# Patient Record
Sex: Female | Born: 1995 | Race: White | Hispanic: No | Marital: Single | Smoking: Never smoker
Health system: Southern US, Community
[De-identification: ages and names within clinical notes are randomized; demographics above are authoritative.]

## PROBLEM LIST (undated history)

## (undated) DIAGNOSIS — Z789 Other specified health status: Secondary | ICD-10-CM

## (undated) HISTORY — PX: NO PAST SURGERIES: SHX2092

---
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
## 2016-01-06 ENCOUNTER — Observation Stay (HOSPITAL_COMMUNITY)
Admission: EM | Admit: 2016-01-06 | Discharge: 2016-01-07 | Disposition: A | Discharge status: Home / Self Care | Payer: Self-pay | Attending: Internal Medicine | Admitting: Internal Medicine

## 2016-01-06 ENCOUNTER — Encounter (HOSPITAL_COMMUNITY): Payer: Self-pay | Admitting: Emergency Medicine

## 2016-01-06 ENCOUNTER — Emergency Department (HOSPITAL_COMMUNITY): Payer: Self-pay

## 2016-01-06 DIAGNOSIS — R55 Syncope and collapse: Secondary | ICD-10-CM | POA: Insufficient documentation

## 2016-01-06 DIAGNOSIS — S02119A Unspecified fracture of occiput, initial encounter for closed fracture: Principal | ICD-10-CM | POA: Diagnosis present

## 2016-01-06 DIAGNOSIS — W19XXXA Unspecified fall, initial encounter: Secondary | ICD-10-CM | POA: Insufficient documentation

## 2016-01-06 DIAGNOSIS — S069X1A Unspecified intracranial injury with loss of consciousness of 30 minutes or less, initial encounter: Secondary | ICD-10-CM

## 2016-01-06 DIAGNOSIS — S0633AA Contusion and laceration of cerebrum, unspecified, with loss of consciousness status unknown, initial encounter: Secondary | ICD-10-CM | POA: Diagnosis present

## 2016-01-06 DIAGNOSIS — S06339A Contusion and laceration of cerebrum, unspecified, with loss of consciousness of unspecified duration, initial encounter: Secondary | ICD-10-CM

## 2016-01-06 DIAGNOSIS — S06320A Contusion and laceration of left cerebrum without loss of consciousness, initial encounter: Secondary | ICD-10-CM | POA: Insufficient documentation

## 2016-01-06 DIAGNOSIS — S069X9A Unspecified intracranial injury with loss of consciousness of unspecified duration, initial encounter: Secondary | ICD-10-CM | POA: Diagnosis present

## 2016-01-06 DIAGNOSIS — S069XAA Unspecified intracranial injury with loss of consciousness status unknown, initial encounter: Secondary | ICD-10-CM | POA: Diagnosis present

## 2016-01-06 HISTORY — DX: Other specified health status: Z78.9

## 2016-01-06 LAB — CBC WITH DIFFERENTIAL/PLATELET
Basophils Absolute: 0 10*3/uL (ref 0.0–0.1)
Basophils Relative: 0 %
Eosinophils Absolute: 0 10*3/uL (ref 0.0–0.7)
Eosinophils Relative: 0 %
HCT: 44.1 % (ref 36.0–46.0)
Hemoglobin: 14.4 g/dL (ref 12.0–15.0)
Lymphocytes Relative: 12 %
Lymphs Abs: 1.4 10*3/uL (ref 0.7–4.0)
MCH: 29.7 pg (ref 26.0–34.0)
MCHC: 32.7 g/dL (ref 30.0–36.0)
MCV: 90.9 fL (ref 78.0–100.0)
Monocytes Absolute: 0.6 10*3/uL (ref 0.1–1.0)
Monocytes Relative: 5 %
Neutro Abs: 9.8 10*3/uL — ABNORMAL HIGH (ref 1.7–7.7)
Neutrophils Relative %: 83 %
Platelets: 263 10*3/uL (ref 150–400)
RBC: 4.85 MIL/uL (ref 3.87–5.11)
RDW: 14.6 % (ref 11.5–15.5)
WBC: 11.9 10*3/uL — ABNORMAL HIGH (ref 4.0–10.5)

## 2016-01-06 LAB — URINALYSIS, ROUTINE W REFLEX MICROSCOPIC
Bilirubin Urine: NEGATIVE
Glucose, UA: NEGATIVE mg/dL
Hgb urine dipstick: NEGATIVE
Ketones, ur: NEGATIVE mg/dL
Leukocytes, UA: NEGATIVE
Nitrite: NEGATIVE
Protein, ur: NEGATIVE mg/dL
Specific Gravity, Urine: 1.025 (ref 1.005–1.030)
pH: 7 (ref 5.0–8.0)

## 2016-01-06 LAB — BASIC METABOLIC PANEL
Anion gap: 8 (ref 5–15)
BUN: 13 mg/dL (ref 6–20)
CO2: 24 mmol/L (ref 22–32)
Calcium: 9.2 mg/dL (ref 8.9–10.3)
Chloride: 106 mmol/L (ref 101–111)
Creatinine, Ser: 0.65 mg/dL (ref 0.44–1.00)
GFR calc Af Amer: >60 mL/min (ref >60)
GFR calc non Af Amer: >60 mL/min (ref >60)
Glucose, Bld: 85 mg/dL (ref 65–99)
Potassium: 4 mmol/L (ref 3.5–5.1)
Sodium: 138 mmol/L (ref 135–145)

## 2016-01-06 LAB — PROTIME-INR
INR: 1.1
Prothrombin Time: 14.3 seconds (ref 11.4–15.2)

## 2016-01-06 LAB — APTT: aPTT: 29 seconds (ref 24–36)

## 2016-01-06 LAB — PREGNANCY, URINE: Preg Test, Ur: NEGATIVE

## 2016-01-06 MED ORDER — ACETAMINOPHEN 325 MG PO TABS
650.0000 mg | ORAL_TABLET | Freq: Four times a day (QID) | ORAL | Status: DC | PRN
  Administered 2016-01-07 (×2): 650 mg via ORAL
  Filled 2016-01-06 (×2): qty 2

## 2016-01-06 MED ORDER — ACETAMINOPHEN 650 MG RE SUPP: 650.0000 mg | Freq: Four times a day (QID) | RECTAL | Status: DC | PRN

## 2016-01-06 NOTE — ED Provider Notes (Signed)
[facility]-EMERGENCY DEPT Provider Note   CSN: [id number] Arrival date & time: 01/06/16  1550     History   Chief Complaint Chief Complaint  Patient presents with  . Near Syncope  . Fall    HPI Maria Patel is a 20 y.o. female.  HPI Patient presents to the emergency department with head injury following a fall from syncope that occurred early this morning.  Patient states that she walked into the kitchen to get some water.  She woke up thirsty and she states she passed out.  She hit her head on the tile floor in the kitchen.  Patient, states she has had continued headache throughout the day with some mild nauseaThe patient denies chest pain, shortness of breath,blurred vision, neck pain, fever, cough, weakness, numbness, dizziness, anorexia, edema, abdominal pain vomiting, diarrhea, rash, back pain, dysuria, hematemesis, bloody stool  Patient Active Problem List   Diagnosis Date Noted  . Closed occipital fracture, initial encounter (HCC) 01/06/2016  . Focal hemorrhagic contusion of cerebrum (HCC) 01/06/2016    History reviewed. No pertinent surgical history.  OB History    No data available       Home Medications    Prior to Admission medications   Not on File    Family History No family history on file.  Social History Social History  Substance Use Topics  . Smoking status: Never Smoker  . Smokeless tobacco: Never Used  . Alcohol use Yes     Allergies   Patient has no known allergies.   Review of Systems Review of Systems All other systems negative except as documented in the HPI. All pertinent positives and negatives as reviewed in the HPI.  Physical Exam Updated Vital Signs BP 127/64   Pulse 76   Temp 98.1 F (36.7 C) (Oral)   Resp 16   LMP 01/03/2016   SpO2 96%   Physical Exam  Constitutional: She is oriented to person, place, and time. She appears well-developed and well-nourished. No distress.  HENT:  Head: Normocephalic and atraumatic.    Mouth/Throat: Oropharynx is clear and moist.  Eyes: Pupils are equal, round, and reactive to light.  Neck: Normal range of motion. Neck supple.  Cardiovascular: Normal rate, regular rhythm and normal heart sounds.  Exam reveals no gallop and no friction rub.   No murmur heard. Pulmonary/Chest: Effort normal and breath sounds normal. No respiratory distress. She has no wheezes.  Abdominal: Soft. Bowel sounds are normal. She exhibits no distension. There is no tenderness.  Neurological: She is alert and oriented to person, place, and time. She exhibits normal muscle tone. Coordination normal.  Skin: Skin is warm and dry. No rash noted. No erythema.  Psychiatric: She has a normal mood and affect. Her behavior is normal.  Nursing note and vitals reviewed.    ED Treatments / Results  Labs (all labs ordered are listed, but only abnormal results are displayed) Labs Reviewed  CBC WITH DIFFERENTIAL/PLATELET - Abnormal; Notable for the following:       Result Value   WBC 11.9 (*)    Neutro Abs 9.8 (*)    All other components within normal limits  URINALYSIS, ROUTINE W REFLEX MICROSCOPIC (NOT AT [facility]) - Abnormal; Notable for the following:    APPearance CLOUDY (*)    All other components within normal limits  BASIC METABOLIC PANEL  PREGNANCY, URINE  PROTIME-INR  APTT    EKG  EKG Interpretation None       Radiology Ct Head Wo  Contrast  Result Date: 01/06/2016 CLINICAL DATA:  20 y/o F; left posterior headache after fall with injury to back of head. EXAM: CT HEAD WITHOUT CONTRAST TECHNIQUE: Contiguous axial images were obtained from the base of the skull through the vertex without intravenous contrast. COMPARISON:  None. FINDINGS: Brain: Parenchymal hematoma centered in the left anterior temporal lobe measuring approximately 17 x 10 x 15 mm (AP x ML x CC) series 2, image 10 and series 5, image 44. There is a small surrounding area of hypoattenuation compatible with vasogenic edema and  mild local mass effect. No extra-axial collection is identified. No additional evidence for brain parenchymal hemorrhage or large territory infarct. Vascular: No hyperdense vessel or unexpected calcification. Skull: Nondisplaced right occipital bone fracture (series 3, image 26 and 3). Sinuses/Orbits: No acute finding. Other: None. IMPRESSION: Hemorrhagic contusion in the left anterior temporal lobe measuring up to 17 mm. Nondisplaced right occipital bone fracture.No significant mass effect, additional hemorrhage, or evidence for infarct identified. These results were called by telephone at the time of interpretation on 01/06/2016 at 6:08 pm to Dr. Charlestine NightHRISTOPHER Jalysa Swopes , who verbally acknowledged these results. Electronically Signed   By: Mitzi HansenLance  Furusawa-Stratton M.D.   On: 01/06/2016 18:08    Procedures Procedures (including critical care time)  Medications Ordered in ED Medications - No data to display   Initial Impression / Assessment and Plan / ED Course  I have reviewed the triage vital signs and the nursing notes.  Pertinent labs & imaging results that were available during my care of the patient were reviewed by me and considered in my medical decision making (see chart for details).  Clinical Course     I spoke with Dr. did have neurosurgery, who advised that the patient does not really need reimaging.  I did speak with the [facility] hospitalist about admission for the patient for observation and reevaluation does have some mild confusion.  When I asked if she had any vomiting.  She asked if I vomited today  Final Clinical Impressions(s) / ED Diagnoses   Final diagnoses:  None    New Prescriptions New Prescriptions   No medications on file     Charlestine NightChristopher Collie Kittel, PA-C 01/06/16 2017    Courteney Randall AnLyn Mackuen, MD 01/06/16 2059    Courteney Lyn Mackuen, MD 01/06/16 2100

## 2016-01-06 NOTE — ED Notes (Signed)
[facility]

## 2016-01-06 NOTE — ED Notes (Signed)
[facility] called for transfer 

## 2016-01-06 NOTE — ED Triage Notes (Signed)
Pt complaint of left posterior headache post fall/fainting this morning 0700.

## 2016-01-06 NOTE — ED Notes (Signed)
ED Provider at bedside. 

## 2016-01-06 NOTE — H&P (Signed)
History and Physical    Maria Patel [id number] DOB: 05/21/1995 DOA: 01/06/2016   PCP: No primary care provider on file. Chief Complaint:  Chief Complaint  Patient presents with  . Near Syncope  . Fall    HPI: Maria HeapJordann Patel is a 20 y.o. female with medical history significant of previously healthy.  Patient had syncope episode earlier today, fell and struck back of head.  Continued headache throughout day since this occurred this morning.  Mild nausea.  Patient presents to ED for symptoms, tried nothing for them and nothing makes better or worse.  ED Course: non-displaced occipital skull fx, hemorrhagic contusion of temporal lobe.  Review of Systems: As per HPI otherwise 10 point review of systems negative.    Past Medical History:  Diagnosis Date  . Medical history non-contributory     Past Surgical History:  Procedure Laterality Date  . NO PAST SURGERIES       reports that she has never smoked. She has never used smokeless tobacco. She reports that she drinks about 9.0 oz of alcohol per week . She reports that she does not use drugs.  No Known Allergies  Family History  Problem Relation Age of Onset  . Heart disease Father   . Sudden Cardiac Death Neg Hx    Reviewed and non-contributory to her current traumatic illness.   Prior to Admission medications   Not on File    Physical Exam: Vitals:   01/06/16 1645 01/06/16 1646 01/06/16 1647 01/06/16 1853  BP: 129/67 116/76 122/64 127/64  Pulse: 83 77 100 76  Resp: 16 16 16 16   Temp:      TempSrc:      SpO2: 99% 99% 99% 96%      Constitutional: NAD, calm, comfortable Eyes: PERRL, lids and conjunctivae normal ENMT: Mucous membranes are moist. Posterior pharynx clear of any exudate or lesions.Normal dentition.  Neck: normal, supple, no masses, no thyromegaly Respiratory: clear to auscultation bilaterally, no wheezing, no crackles. Normal respiratory effort. No accessory muscle use.  Cardiovascular:  Regular rate and rhythm, no murmurs / rubs / gallops. No extremity edema. 2+ pedal pulses. No carotid bruits.  Abdomen: no tenderness, no masses palpated. No hepatosplenomegaly. Bowel sounds positive.  Musculoskeletal: no clubbing / cyanosis. No joint deformity upper and lower extremities. Good ROM, no contractures. Normal muscle tone.  Skin: no rashes, lesions, ulcers. No induration Neurologic: CN 2-12 grossly intact. Sensation intact, DTR normal. Strength 5/5 in all 4.  Psychiatric: Normal judgment and insight. Alert and oriented x 3. Normal mood.    Labs on Admission: I have personally reviewed following labs and imaging studies  CBC:  Recent Labs Lab 01/06/16 1715  WBC 11.9*  NEUTROABS 9.8*  HGB 14.4  HCT 44.1  MCV 90.9  PLT 263   Basic Metabolic Panel:  Recent Labs Lab 01/06/16 1715  NA 138  K 4.0  CL 106  CO2 24  GLUCOSE 85  BUN 13  CREATININE 0.65  CALCIUM 9.2   GFR: CrCl cannot be calculated (Unknown ideal weight.). Liver Function Tests: No results for input(s): AST, ALT, ALKPHOS, BILITOT, PROT, ALBUMIN in the last 168 hours. No results for input(s): LIPASE, AMYLASE in the last 168 hours. No results for input(s): AMMONIA in the last 168 hours. Coagulation Profile:  Recent Labs Lab 01/06/16 1829  INR 1.10   Cardiac Enzymes: No results for input(s): CKTOTAL, CKMB, CKMBINDEX, TROPONINI in the last 168 hours. BNP (last 3 results) No results for input(s): PROBNP in the  last 8760 hours. HbA1C: No results for input(s): HGBA1C in the last 72 hours. CBG: No results for input(s): GLUCAP in the last 168 hours. Lipid Profile: No results for input(s): CHOL, HDL, LDLCALC, TRIG, CHOLHDL, LDLDIRECT in the last 72 hours. Thyroid Function Tests: No results for input(s): TSH, T4TOTAL, FREET4, T3FREE, THYROIDAB in the last 72 hours. Anemia Panel: No results for input(s): VITAMINB12, FOLATE, FERRITIN, TIBC, IRON, RETICCTPCT in the last 72 hours. Urine analysis:      Component Value Date/Time   COLORURINE YELLOW 01/06/2016 1715   APPEARANCEUR CLOUDY (A) 01/06/2016 1715   LABSPEC 1.025 01/06/2016 1715   PHURINE 7.0 01/06/2016 1715   GLUCOSEU NEGATIVE 01/06/2016 1715   HGBUR NEGATIVE 01/06/2016 1715   BILIRUBINUR NEGATIVE 01/06/2016 1715   KETONESUR NEGATIVE 01/06/2016 1715   PROTEINUR NEGATIVE 01/06/2016 1715   NITRITE NEGATIVE 01/06/2016 1715   LEUKOCYTESUR NEGATIVE 01/06/2016 1715   Sepsis Labs: @LABRCNTIP (procalcitonin:4,lacticidven:4) )No results found for this or any previous visit (from the past 240 hour(s)).   Radiological Exams on Admission: Ct Head Wo Contrast  Result Date: 01/06/2016 CLINICAL DATA:  20 y/o F; left posterior headache after fall with injury to back of head. EXAM: CT HEAD WITHOUT CONTRAST TECHNIQUE: Contiguous axial images were obtained from the base of the skull through the vertex without intravenous contrast. COMPARISON:  None. FINDINGS: Brain: Parenchymal hematoma centered in the left anterior temporal lobe measuring approximately 17 x 10 x 15 mm (AP x ML x CC) series 2, image 10 and series 5, image 44. There is a small surrounding area of hypoattenuation compatible with vasogenic edema and mild local mass effect. No extra-axial collection is identified. No additional evidence for brain parenchymal hemorrhage or large territory infarct. Vascular: No hyperdense vessel or unexpected calcification. Skull: Nondisplaced right occipital bone fracture (series 3, image 26 and 3). Sinuses/Orbits: No acute finding. Other: None. IMPRESSION: Hemorrhagic contusion in the left anterior temporal lobe measuring up to 17 mm. Nondisplaced right occipital bone fracture.No significant mass effect, additional hemorrhage, or evidence for infarct identified. These results were called by telephone at the time of interpretation on 01/06/2016 at 6:08 pm to Dr. Charlestine NightHRISTOPHER LAWYER , who verbally acknowledged these results. Electronically Signed   By: Mitzi HansenLance   Furusawa-Stratton M.D.   On: 01/06/2016 18:08    EKG: Independently reviewed.  Assessment/Plan Principal Problem:   Focal hemorrhagic contusion of cerebrum (HCC) Active Problems:   Closed occipital fracture, initial encounter (HCC)   Closed TBI (traumatic brain injury) (HCC)    1. TBI - 1. Dr. Bevely Palmeritty consulted by EDP and felt that patient didn't need reimaging and actually could be sent home. 2. EDP wants patient admitted overnight for observation 3. Q4H neuro checks 4. Tylenol PRN headache   DVT prophylaxis: Low risk Code Status: Full Family Communication: Family at bedside Consults called: Dr. Bevely Palmeritty called by EDP Admission status: Admit to obs   Hillary BowGARDNER, JARED M. DO [facility] Pager [phone] from 7PM-7AM  If 7AM-7PM, please contact the day physician for the patient [email or web address] Password [email or web address]  01/06/2016, 8:38 PM

## 2016-01-06 NOTE — ED Notes (Signed)
Bed: WA24 Expected date:  Expected time:  Means of arrival:  Comments: Hall C 

## 2016-01-06 NOTE — ED Notes (Signed)
66M called for report; room has not been assigned to nurse so will need to call back

## 2016-01-07 DIAGNOSIS — S06339A Contusion and laceration of cerebrum, unspecified, with loss of consciousness of unspecified duration, initial encounter: Secondary | ICD-10-CM

## 2016-01-07 MED ORDER — ACETAMINOPHEN 325 MG PO TABS: 650.0000 mg | ORAL_TABLET | Freq: Four times a day (QID) | ORAL | Status: AC | PRN

## 2016-01-07 NOTE — Progress Notes (Signed)
Patient orthostatic BP was communicated to Dr Izola PriceMyers and she gave OK to discharge the patient. Discharge instruction s were given to patient and family

## 2016-01-07 NOTE — Discharge Summary (Signed)
Physician Discharge Summary  Maria Patel [id number] DOB: 04/29/1995 DOA: 01/06/2016  PCP: No primary care provider on file.  Admit date: 01/06/2016 Discharge date: 01/07/2016  Recommendations for Outpatient Follow-up:  1. Pt will need to follow up with PCP in 2-3 weeks post discharge   Discharge Diagnoses:  Principal Problem:   Focal hemorrhagic contusion of cerebrum (HCC) Active Problems:   Closed occipital fracture, initial encounter (HCC)   Closed TBI (traumatic brain injury) [facility](HCC)  Discharge Condition: Stable  Diet recommendation: Regular   History of present illness:  20 y.o. female with no significant past medical history. Patient had syncope episode earlier in the day prior to this admission, fell and struck back of head.  Continued headache throughout day since this occurred.  Hospital Course:  Principal Problem:   Focal hemorrhagic contusion of cerebrum (HCC) - after an episode of fall of no clear etiology - neurosurgery consulted and said no need for repeat CT head, can be discharged home if neurological status stable - no focal neurological concerns this AM  - orthostatic positive by HR but otherwise stable - encouraged oral hydration and follow up with PCP  - pt ambulated per RN staff with no difficulty   Procedures/Studies: Ct Head Wo Contrast  Result Date: 01/06/2016 CLINICAL DATA:  20 y/o F; left posterior headache after fall with injury to back of head. EXAM: CT HEAD WITHOUT CONTRAST TECHNIQUE: Contiguous axial images were obtained from the base of the skull through the vertex without intravenous contrast. COMPARISON:  None. FINDINGS: Brain: Parenchymal hematoma centered in the left anterior temporal lobe measuring approximately 17 x 10 x 15 mm (AP x ML x CC) series 2, image 10 and series 5, image 44. There is a small surrounding area of hypoattenuation compatible with vasogenic edema and mild local mass effect. No extra-axial collection is identified.  No additional evidence for brain parenchymal hemorrhage or large territory infarct. Vascular: No hyperdense vessel or unexpected calcification. Skull: Nondisplaced right occipital bone fracture (series 3, image 26 and 3). Sinuses/Orbits: No acute finding. Other: None. IMPRESSION: Hemorrhagic contusion in the left anterior temporal lobe measuring up to 17 mm. Nondisplaced right occipital bone fracture.No significant mass effect, additional hemorrhage, or evidence for infarct identified. These results were called by telephone at the time of interpretation on 01/06/2016 at 6:08 pm to Dr. Charlestine NightHRISTOPHER LAWYER , who verbally acknowledged these results. Electronically Signed   By: Mitzi HansenLance  Furusawa-Stratton M.D.   On: 01/06/2016 18:08     Discharge Exam: Vitals:   01/07/16 0549 01/07/16 0555  BP: (!) 93/40 (!) 121/57  Pulse: (!) 49   Resp: 16   Temp: 98 F (36.7 C)    Vitals:   01/06/16 2203 01/07/16 0142 01/07/16 0549 01/07/16 0555  BP: 140/71 124/65 (!) 93/40 (!) 121/57  Pulse: (!) 59 (!) 56 (!) 49   Resp: 18 16 16    Temp: 99.5 F (37.5 C) 98.6 F (37 C) 98 F (36.7 C)   TempSrc: Oral Oral Oral   SpO2: 100% 100% 99%   Weight: 54.4 kg (120 lb)     Height: 5\' 6"  (1.676 m)       General: Pt is alert, follows commands appropriately, not in acute distress Cardiovascular: Regular rhythm, bradycardic, S1/S2 +, no murmurs, no rubs, no gallops Respiratory: Clear to auscultation bilaterally, no wheezing, no crackles, no rhonchi Abdominal: Soft, non tender, non distended, bowel sounds +, no guarding Extremities: no edema, no cyanosis, pulses palpable bilaterally DP and PT Neuro: Grossly  nonfocal  Discharge Instructions  Discharge Instructions    Diet - low sodium heart healthy    Complete by:  As directed    Increase activity slowly    Complete by:  As directed        Medication List    TAKE these medications   acetaminophen 325 MG tablet Commonly known as:  TYLENOL Take 2 tablets (650  mg total) by mouth every 6 (six) hours as needed for mild pain (or Fever >/= 101).      Follow-up Information    MAGICK-Latera Mclin, Sherlon HandingISKRA, MD Follow up.   Specialty:  Internal Medicine Why:  Please call me with questions. [phone] Contact information: [address] [place] [address] [phone]            The results of significant diagnostics from this hospitalization (including imaging, microbiology, ancillary and laboratory) are listed below for reference.     Microbiology: No results found for this or any previous visit (from the past 240 hour(s)).   Labs: Basic Metabolic Panel:  Recent Labs Lab 01/06/16 1715  NA 138  K 4.0  CL 106  CO2 24  GLUCOSE 85  BUN 13  CREATININE 0.65  CALCIUM 9.2   CBC:  Recent Labs Lab 01/06/16 1715  WBC 11.9*  NEUTROABS 9.8*  HGB 14.4  HCT 44.1  MCV 90.9  PLT 263   SIGNED: Time coordinating discharge:  30 minutes  Debbora PrestoMAGICK-Louan Base, MD  [facility] 01/07/2016, 9:08 AM Pager [phone]  If 7PM-7AM, please contact night-coverage [email or web address] Password [email or web address]

## 2016-01-07 NOTE — Discharge Instructions (Signed)
Syncope °Syncope is when you temporarily lose consciousness. Syncope may also be called fainting or passing out. It is caused by a sudden decrease in blood flow to the brain. Even though most causes of syncope are not dangerous, syncope can be a sign of a serious medical problem. Signs that you may be about to faint include: °· Feeling dizzy or light-headed. °· Feeling nauseous. °· Seeing all white or all black in your field of vision. °· Having cold, clammy skin. °If you fainted, get medical help right away.Call your local emergency services (911 in the [place] Do not drive yourself to the hospital. °Follow these instructions at home: °Pay attention to any changes in your symptoms. Take these actions to help with your condition: °· Have someone stay with you until you feel stable. °· Do not drive, use machinery, or play sports until your health care provider says it is okay. °· Keep all follow-up visits as told by your health care provider. This is important. °· If you start to feel like you might faint, lie down right away and raise (elevate) your feet above the level of your heart. Breathe deeply and steadily. Wait until all of the symptoms have passed. °· Drink enough fluid to keep your urine clear or pale yellow. °· If you are taking blood pressure or heart medicine, get up slowly and take several minutes to sit and then stand. This can reduce dizziness. °· Take over-the-counter and prescription medicines only as told by your health care provider. °Get help right away if: °· You have a severe headache. °· You have unusual pain in your chest, abdomen, or back. °· You are bleeding from your mouth or rectum, or you have black or tarry stool. °· You have a very fast or irregular heartbeat (palpitations). °· You have pain with breathing. °· You faint once or repeatedly. °· You have a seizure. °· You are confused. °· You have trouble walking. °· You have severe weakness. °· You have vision problems. °These symptoms  may represent a serious problem that is an emergency. Do not wait to see if your symptoms will go away. Get medical help right away. Call your local emergency services (911 in the [place] Do not drive yourself to the hospital. °This information is not intended to replace advice given to you by your health care provider. Make sure you discuss any questions you have with your health care provider. °Document Released: 02/05/2005 Document Revised: 07/14/2015 Document Reviewed: 10/20/2014 °Elsevier Interactive Patient Education © 2017 [employer] ° °

## 2018-03-20 ENCOUNTER — Emergency Department (HOSPITAL_COMMUNITY)
Admission: EM | Admit: 2018-03-20 | Discharge: 2018-03-20 | Disposition: A | Discharge status: Home / Self Care | Payer: Self-pay | Attending: Emergency Medicine | Admitting: Emergency Medicine

## 2018-03-20 ENCOUNTER — Encounter (HOSPITAL_COMMUNITY): Payer: Self-pay

## 2018-03-20 ENCOUNTER — Other Ambulatory Visit: Payer: Self-pay

## 2018-03-20 ENCOUNTER — Emergency Department (HOSPITAL_COMMUNITY): Payer: Self-pay

## 2018-03-20 DIAGNOSIS — T148XXA Other injury of unspecified body region, initial encounter: Secondary | ICD-10-CM

## 2018-03-20 DIAGNOSIS — X500XXA Overexertion from strenuous movement or load, initial encounter: Secondary | ICD-10-CM | POA: Insufficient documentation

## 2018-03-20 DIAGNOSIS — Y92003 Bedroom of unspecified non-institutional (private) residence as the place of occurrence of the external cause: Secondary | ICD-10-CM | POA: Insufficient documentation

## 2018-03-20 DIAGNOSIS — S29011A Strain of muscle and tendon of front wall of thorax, initial encounter: Secondary | ICD-10-CM | POA: Insufficient documentation

## 2018-03-20 DIAGNOSIS — Y9384 Activity, sleeping: Secondary | ICD-10-CM | POA: Insufficient documentation

## 2018-03-20 DIAGNOSIS — Y999 Unspecified external cause status: Secondary | ICD-10-CM | POA: Insufficient documentation

## 2018-03-20 LAB — COMPREHENSIVE METABOLIC PANEL
ALBUMIN: 4.3 g/dL (ref 3.5–5.0)
ALT: 20 U/L (ref 0–44)
ANION GAP: 10 (ref 5–15)
AST: 21 U/L (ref 15–41)
Alkaline Phosphatase: 65 U/L (ref 38–126)
BUN: 8 mg/dL (ref 6–20)
CHLORIDE: 105 mmol/L (ref 98–111)
CO2: 21 mmol/L — AB (ref 22–32)
Calcium: 8.9 mg/dL (ref 8.9–10.3)
Creatinine, Ser: 0.61 mg/dL (ref 0.44–1.00)
GFR calc Af Amer: >60 mL/min (ref >60)
GFR calc non Af Amer: >60 mL/min (ref >60)
GLUCOSE: 104 mg/dL — AB (ref 70–99)
POTASSIUM: 3.4 mmol/L — AB (ref 3.5–5.1)
Sodium: 136 mmol/L (ref 135–145)
TOTAL PROTEIN: 7.2 g/dL (ref 6.5–8.1)
Total Bilirubin: 0.7 mg/dL (ref 0.3–1.2)

## 2018-03-20 LAB — URINALYSIS, ROUTINE W REFLEX MICROSCOPIC
Bilirubin Urine: NEGATIVE
GLUCOSE, UA: NEGATIVE mg/dL
Ketones, ur: NEGATIVE mg/dL
Nitrite: NEGATIVE
PROTEIN: NEGATIVE mg/dL
Specific Gravity, Urine: 1.023 (ref 1.005–1.030)
pH: 5 (ref 5.0–8.0)

## 2018-03-20 LAB — LIPASE, BLOOD: Lipase: 26 U/L (ref 11–51)

## 2018-03-20 LAB — CBC
HEMATOCRIT: 40.1 % (ref 36.0–46.0)
HEMOGLOBIN: 12.3 g/dL (ref 12.0–15.0)
MCH: 28 pg (ref 26.0–34.0)
MCHC: 30.7 g/dL (ref 30.0–36.0)
MCV: 91.1 fL (ref 80.0–100.0)
Platelets: 267 10*3/uL (ref 150–400)
RBC: 4.4 MIL/uL (ref 3.87–5.11)
RDW: 14.1 % (ref 11.5–15.5)
WBC: 10.8 10*3/uL — ABNORMAL HIGH (ref 4.0–10.5)
nRBC: 0 % (ref 0.0–0.2)

## 2018-03-20 LAB — I-STAT BETA HCG BLOOD, ED (MC, WL, AP ONLY)

## 2018-03-20 MED ORDER — CYCLOBENZAPRINE HCL 10 MG PO TABS
10.0000 mg | ORAL_TABLET | Freq: Once | ORAL | Status: AC
  Administered 2018-03-20: 10 mg via ORAL
  Filled 2018-03-20: qty 1

## 2018-03-20 MED ORDER — NAPROXEN 375 MG PO TABS: 375.0000 mg | ORAL_TABLET | Freq: Two times a day (BID) | ORAL | 0 refills | Status: AC

## 2018-03-20 MED ORDER — KETOROLAC TROMETHAMINE 60 MG/2ML IM SOLN
30.0000 mg | Freq: Once | INTRAMUSCULAR | Status: AC
  Administered 2018-03-20: 30 mg via INTRAMUSCULAR
  Filled 2018-03-20: qty 2

## 2018-03-20 MED ORDER — SODIUM CHLORIDE 0.9% FLUSH: 3.0000 mL | Freq: Once | INTRAVENOUS | Status: DC

## 2018-03-20 MED ORDER — CYCLOBENZAPRINE HCL 5 MG PO TABS: 5.0000 mg | ORAL_TABLET | Freq: Two times a day (BID) | ORAL | 0 refills | Status: AC | PRN

## 2018-03-20 NOTE — ED Notes (Signed)
Pt is currently eating popcorn and drinking Pepsi. Denies NVD

## 2018-03-20 NOTE — ED Provider Notes (Signed)
[facility]-EMERGENCY DEPT Provider Note   CSN: [id number] Arrival date & time: 03/20/18  1040     History   Chief Complaint Chief Complaint  Patient presents with  . Abdominal Pain    HPI Maria Patel is a 23 y.o. female who presents to the ED with right side pain. Patient reports she woke this morning and started to roll over and felt a pain in the right rib area that was sharp and stabbing. The pain increases with deep breath and movement. 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 Patient reports she has not taken any medication for the pain.    HPI  Past Medical History:  Diagnosis Date  . Medical history non-contributory     Patient Active Problem List   Diagnosis Date Noted  . Closed occipital fracture, initial encounter (HCC) 01/06/2016  . 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 Closed TBI (traumatic brain injury) (HCC) 01/06/2016    Past Surgical History:  Procedure Laterality Date  . NO PAST SURGERIES       OB History   No obstetric history on file.      Home Medications    Prior to Admission medications   Medication Sig Start Date End Date Taking? Authorizing Provider  acetaminophen (TYLENOL) 325 MG tablet Take 2 tablets (650 mg total) by mouth every 6 (six) hours as needed for mild pain (or Fever >/= 101). 01/07/16   Dorothea Ogle, MD  cyclobenzaprine (FLEXERIL) 5 MG tablet Take 1 tablet (5 mg total) by mouth 2 (two) times daily as needed for muscle spasms. 03/20/18   Janne Napoleon, NP  naproxen (NAPROSYN) 375 MG tablet Take 1 tablet (375 mg total) by mouth 2 (two) times daily. 03/20/18   Janne Napoleon, NP    Family History Family History  Problem Relation Age of Onset  . Heart disease Father   . Sudden Cardiac Death Neg Hx     Social History Social History   Tobacco Use  . Smoking status: Never Smoker  . Smokeless tobacco:  Never Used  Substance Use Topics  . Alcohol use: Yes    Comment: 3-4 nights a week  . Drug use: Yes    Types: Marijuana    Comment: 3-4 times a week     Allergies   Patient has no known allergies.   Review of Systems Review of Systems  Constitutional: Negative for chills and fever.  HENT: Negative.   Eyes: Negative for visual disturbance.  Respiratory: Negative for cough and wheezing.   Cardiovascular: Chest pain: rib pain right.  Gastrointestinal: Negative for abdominal pain, diarrhea, nausea and vomiting.  Genitourinary: Positive for vaginal bleeding. Negative for difficulty urinating, dysuria, frequency, urgency and vaginal discharge.  Musculoskeletal: Negative for back pain, neck pain and neck stiffness.  Skin: Negative for rash and wound.  Neurological: Negative for syncope and headaches.  Psychiatric/Behavioral: Negative for confusion.     Physical Exam Updated Vital Signs BP 127/85 (BP Location: Right Arm)   Pulse 90   Temp 98.2 F (36.8 C) (Oral)   Resp 16   Ht 5\' 6"  (1.676 m)   Wt 65.8 kg   LMP 03/18/2018   SpO2 100%   BMI 23.40 kg/m   Physical Exam Vitals signs and nursing note reviewed.  Constitutional:      General: She is not in acute distress.  Appearance: She is well-developed.  HENT:     Head: Normocephalic and atraumatic.     Nose: Nose normal.     Mouth/Throat:     Mouth: Mucous membranes are moist.  Eyes:     Conjunctiva/sclera: Conjunctivae normal.  Neck:     Musculoskeletal: Neck supple. No neck rigidity.  Cardiovascular:     Rate and Rhythm: Normal rate and regular rhythm.  Pulmonary:     Effort: Pulmonary effort is normal.     Breath sounds: Normal breath sounds.  Chest:       Comments: Right anterior rib tenderness on palpation.  Abdominal:     General: Bowel sounds are normal.     Palpations: Abdomen is soft.     Tenderness: There is no abdominal tenderness.  Musculoskeletal: Normal range of motion.  Skin:    General:  Skin is warm and dry.  Neurological:     Mental Status: She is alert and oriented to person, place, and time.     Cranial Nerves: No cranial nerve deficit.  Psychiatric:        Mood and Affect: Mood normal.      ED Treatments / Results  Labs (all labs ordered are listed, but only abnormal results are displayed) Labs Reviewed  COMPREHENSIVE METABOLIC PANEL - Abnormal; Notable for the following components:      Result Value   Potassium 3.4 (*)    CO2 21 (*)    Glucose, Bld 104 (*)    All other components within normal limits  CBC - Abnormal; Notable for the following components:   WBC 10.8 (*)    All other components within normal limits  URINALYSIS, ROUTINE W REFLEX MICROSCOPIC - Abnormal; Notable for the following components:   APPearance HAZY (*)    Hgb urine dipstick MODERATE (*)    Leukocytes, UA TRACE (*)    Bacteria, UA RARE (*)    All other components within normal limits  LIPASE, BLOOD  I-STAT BETA HCG BLOOD, ED ([facility], [facility], [facility] ONLY)    Radiology Dg Ribs Unilateral W/chest Right  Result Date: 03/20/2018 CLINICAL DATA:  Right flank pain for 1 day. EXAM: RIGHT RIBS AND CHEST - 3+ VIEW COMPARISON:  None. FINDINGS: No fracture or other bone lesions are seen involving the ribs. There is no evidence of pneumothorax or pleural effusion. Both lungs are clear. Heart size and mediastinal contours are within normal limits. IMPRESSION: No acute fracture dislocation of right ribs. Electronically Signed   By: Sherian ReinWei-Chen  Lin M.D.   On: 03/20/2018 14:50    Procedures Procedures (including critical care time)  Medications Ordered in ED Medications  ketorolac (TORADOL) injection 30 mg (30 mg Intramuscular Given 03/20/18 1343)  cyclobenzaprine (FLEXERIL) tablet 10 mg (10 mg Oral Given 03/20/18 1342)     Initial Impression / Assessment and Plan / ED Course  I have reviewed the triage vital signs and the nursing notes. 23 y.o. female here with right side pain with movement stable for  d/c with no acute findings on x-ray. Patients symptoms improved with treatment in the ED. Return precautions discussed.   Final Clinical Impressions(s) / ED Diagnoses   Final diagnoses:  Muscle strain    ED Discharge Orders         Ordered    cyclobenzaprine (FLEXERIL) 5 MG tablet  2 times daily PRN     03/20/18 1456    naproxen (NAPROSYN) 375 MG tablet  2 times daily     03/20/18 1456  Kerrie [place], [place] 03/20/18 1504    Shaune Pollack, MD 03/20/18 [phone]

## 2018-03-20 NOTE — ED Triage Notes (Signed)
Patient c/o right upper abdominal pain since 0730 today. Patient denies any n/V/d.

## 2018-03-20 NOTE — Discharge Instructions (Addendum)
Do not drive while taking the muscle relaxer as it will make your sleepy. Follow up with your doctor or return here for worsening symptoms.

## 2018-08-01 ENCOUNTER — Ambulatory Visit (HOSPITAL_COMMUNITY)
Admission: RE | Admit: 2018-08-01 | Discharge: 2018-08-01 | Disposition: A | Discharge status: Home / Self Care | Payer: Self-pay | Attending: Psychiatry | Admitting: Psychiatry

## 2018-08-01 DIAGNOSIS — F331 Major depressive disorder, recurrent, moderate: Secondary | ICD-10-CM | POA: Insufficient documentation

## 2018-08-01 DIAGNOSIS — F1290 Cannabis use, unspecified, uncomplicated: Secondary | ICD-10-CM | POA: Insufficient documentation

## 2018-08-01 DIAGNOSIS — F1490 Cocaine use, unspecified, uncomplicated: Secondary | ICD-10-CM | POA: Insufficient documentation

## 2018-08-01 DIAGNOSIS — R45851 Suicidal ideations: Secondary | ICD-10-CM | POA: Insufficient documentation
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
## 2018-08-01 NOTE — H&P (Signed)
Behavioral Health Medical Screening Exam  Maria Patel is an 23 y.o. female.  Total Time spent with patient: 20 minutes  Psychiatric Specialty Exam: Physical Exam  Constitutional: She is oriented to person, place, and time. She appears well-developed and well-nourished. No distress.  HENT:  Head: Normocephalic and atraumatic.  Right Ear: External ear normal.  Left Ear: External ear normal.  Eyes: Pupils are equal, round, and reactive to light. Right eye exhibits no discharge. Left eye exhibits no discharge. No scleral icterus.  Respiratory: Effort normal. No respiratory distress.  Musculoskeletal: Normal range of motion.  Neurological: She is alert and oriented to person, place, and time.  Skin: She is not diaphoretic.  Psychiatric: Her mood appears anxious. She is not withdrawn and not actively hallucinating. She exhibits a depressed mood.    Review of Systems  Constitutional: Negative for chills, diaphoresis, fever, malaise/fatigue and weight loss.  Respiratory: Negative for cough and shortness of breath.   Cardiovascular: Negative for chest pain.  Gastrointestinal: Negative for diarrhea, nausea and vomiting.    Blood pressure (!) 134/104, pulse 92, temperature 98.6 F (37 C), temperature source Oral, resp. rate 18, SpO2 99 %.There is no height or weight on file to calculate BMI.  General Appearance: Casual and Fairly Groomed  Eye Contact:  Good  Speech:  Clear and Coherent and Normal Rate  Volume:  Normal  Mood:  Anxious and Depressed  Affect:  Congruent and Depressed  Thought Process:  Coherent, Goal Directed and Descriptions of Associations: Intact  Orientation:  Full (Time, Place, and Person)  Thought Content:  Logical and Hallucinations: None  Suicidal Thoughts:  Yes.  without intent/plan  Homicidal Thoughts:  No  Memory:  Immediate;   Good Recent;   Good  Judgement:  Fair  Insight:  Fair  Psychomotor Activity:  Normal  Concentration: Concentration: Good and  Attention Span: Good  Recall:  Good  Fund of Knowledge:Good  Language: Good  Akathisia:  Negative  Handed:  Right  AIMS (if indicated):     Assets:  Communication Skills Desire for Improvement Housing Intimacy Leisure Time Physical Health  Sleep:       Musculoskeletal: Strength & Muscle Tone: within normal limits Gait & Station: normal   Blood pressure (!) 134/104, pulse 92, temperature 98.6 F (37 C), temperature source Oral, resp. rate 18, SpO2 99 %.  Recommendations:  Based on my evaluation the patient does not appear to have an emergency medical condition.   No evidence of imminent risk to self or others at present.   Patient does not meet criteria for psychiatric inpatient admission. Supportive therapy provided about ongoing stressors. Discussed crisis plan, support from social network, calling 911, coming to the Emergency Department, and calling Suicide Hotline.  Rozetta Nunnery, NP 08/01/2018, 5:50 AM

## 2019-05-30 IMAGING — CR DG RIBS W/ CHEST 3+V*R*
4 series · 4 of 4 positions shown · non-contrast
Comparison: None.

CLINICAL DATA: Right flank pain for 1 day.

EXAM:
RIGHT RIBS AND CHEST - 3+ VIEW

[w chest pa]
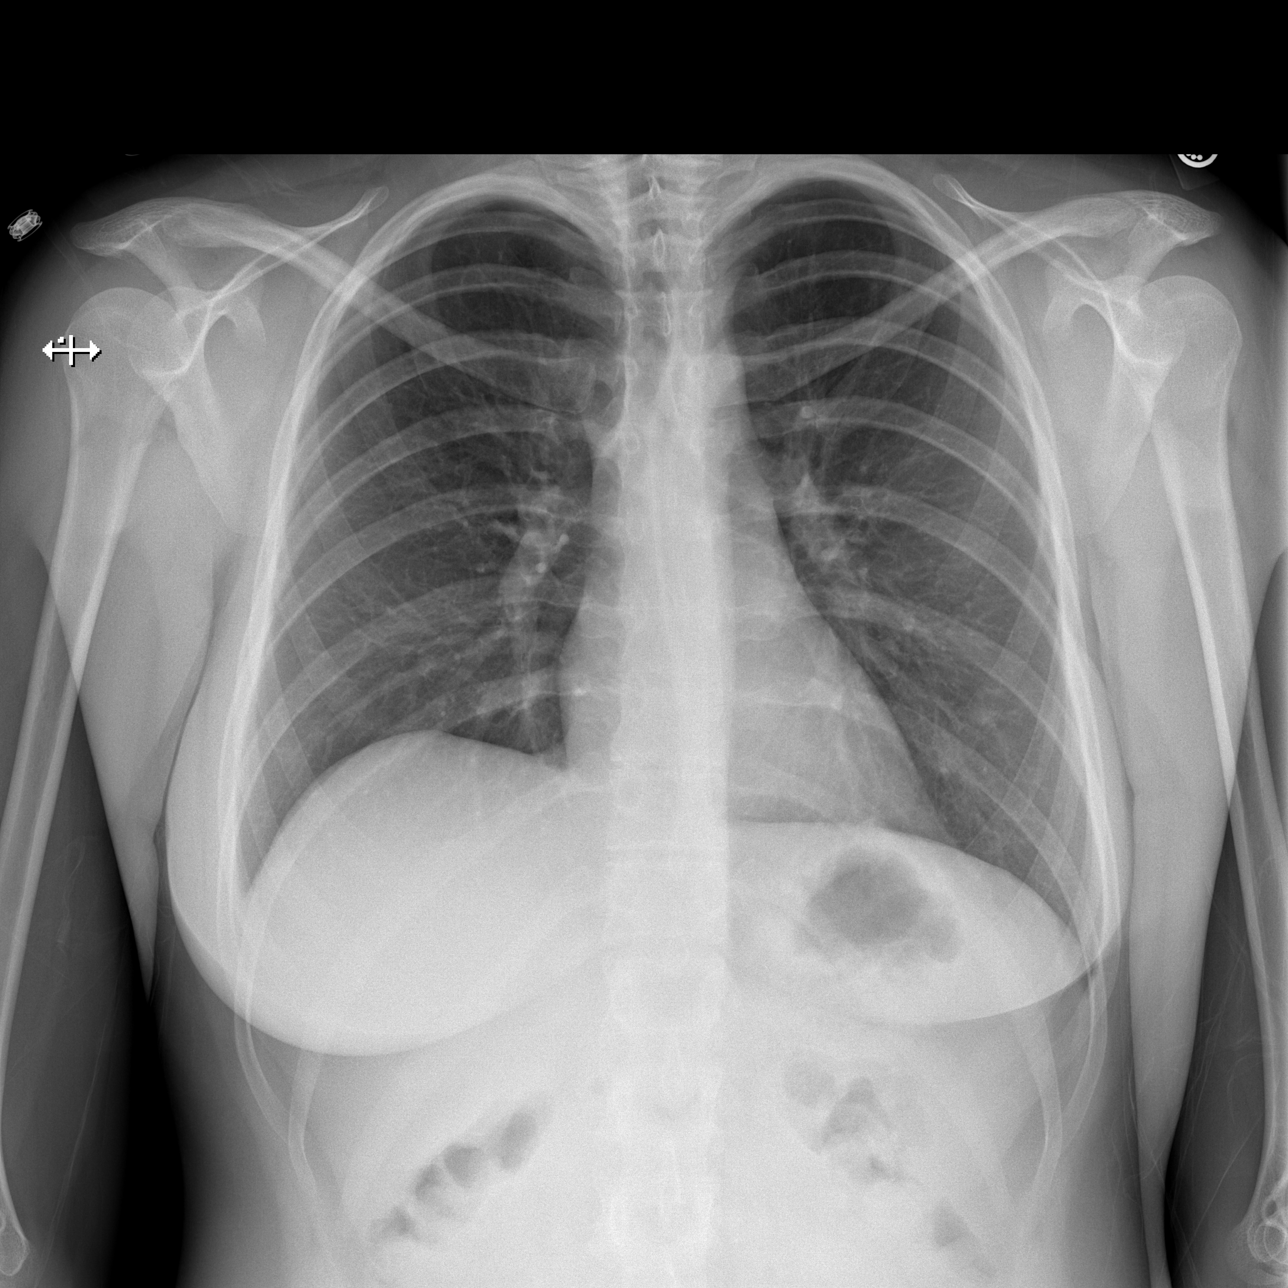

[w ribs ap upper right]
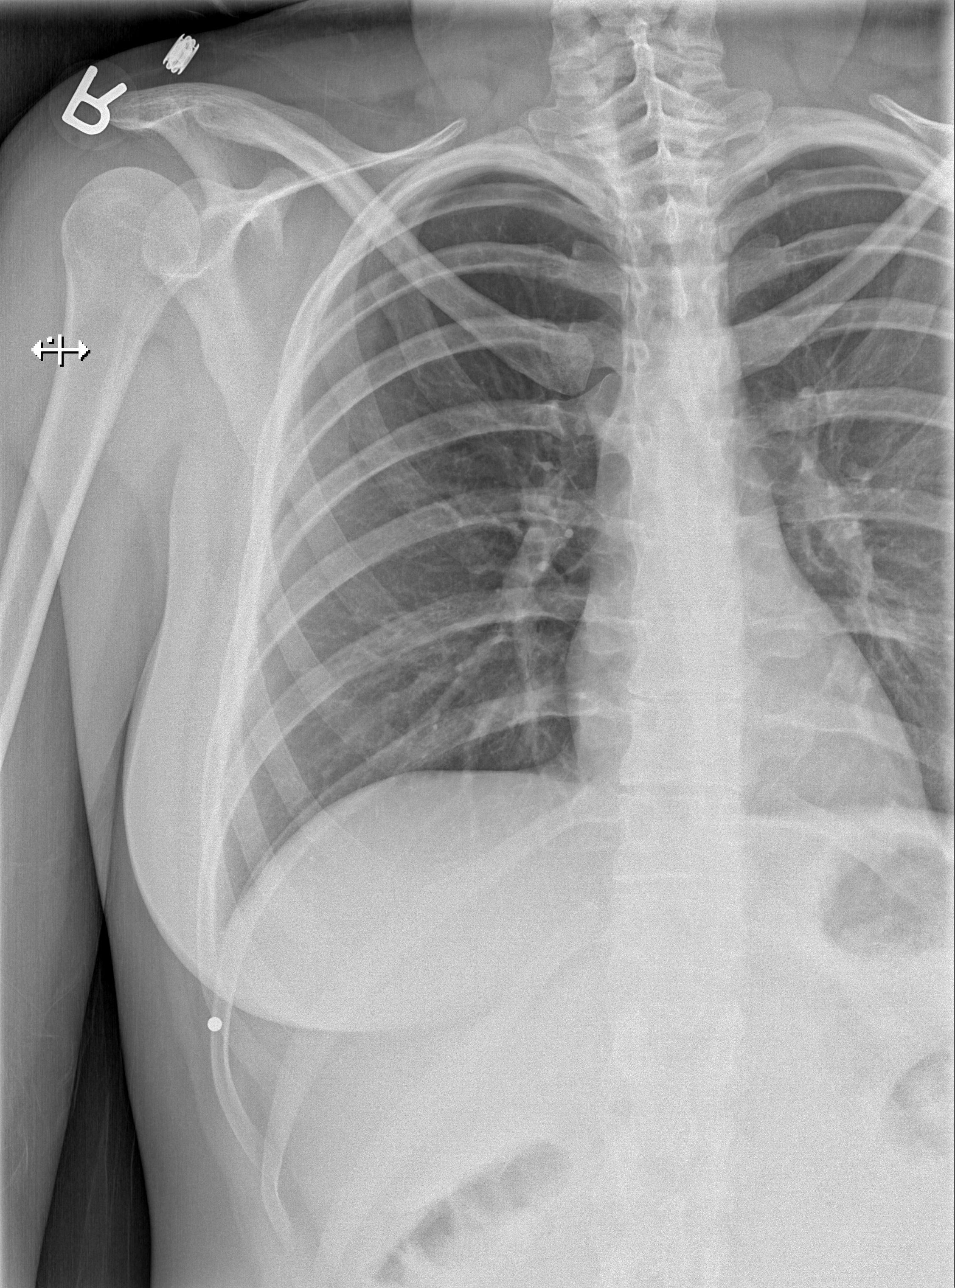

[w ribs ap lower right]
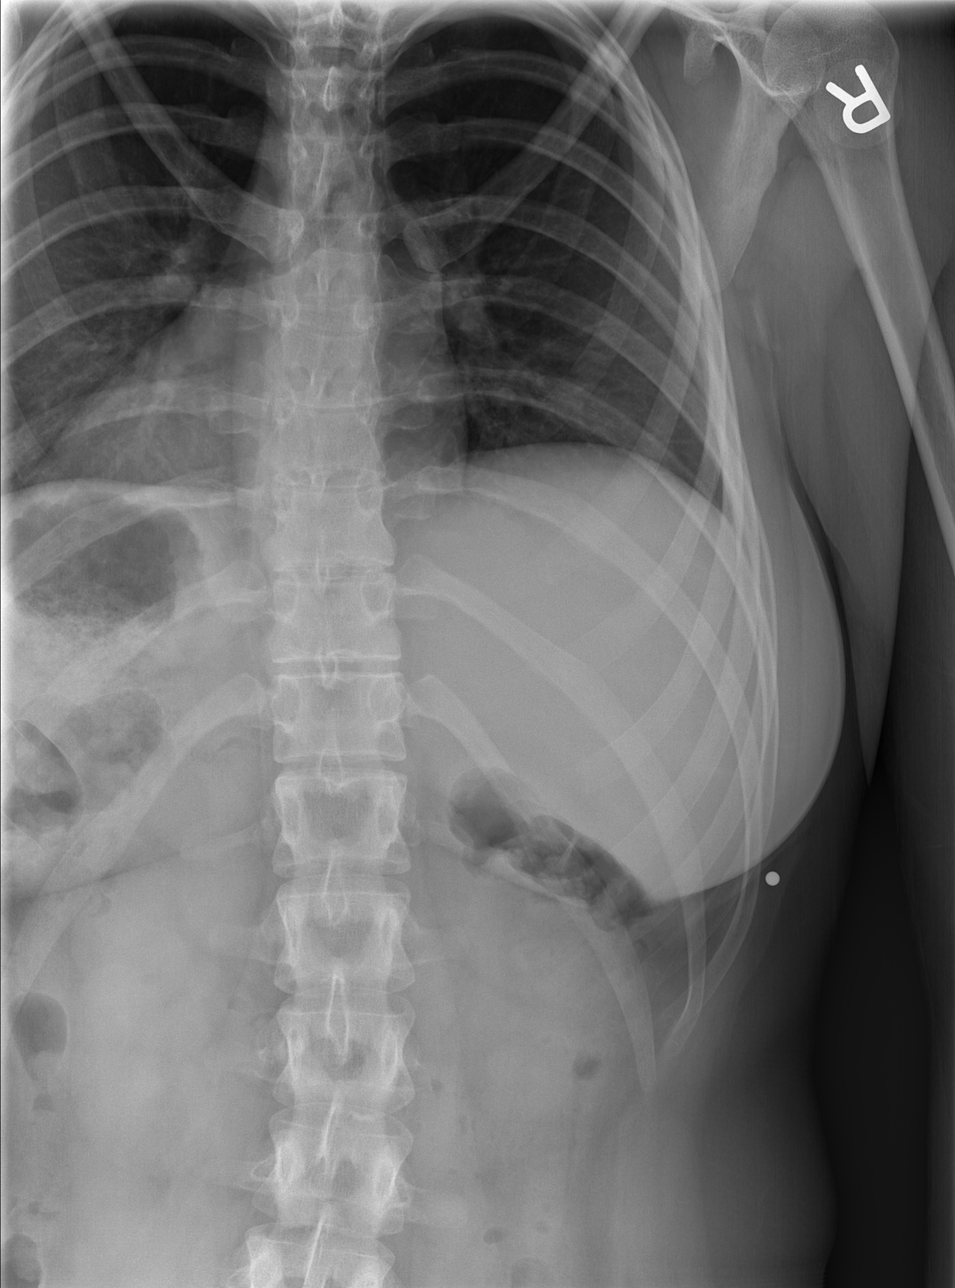

[w ribs obl right]
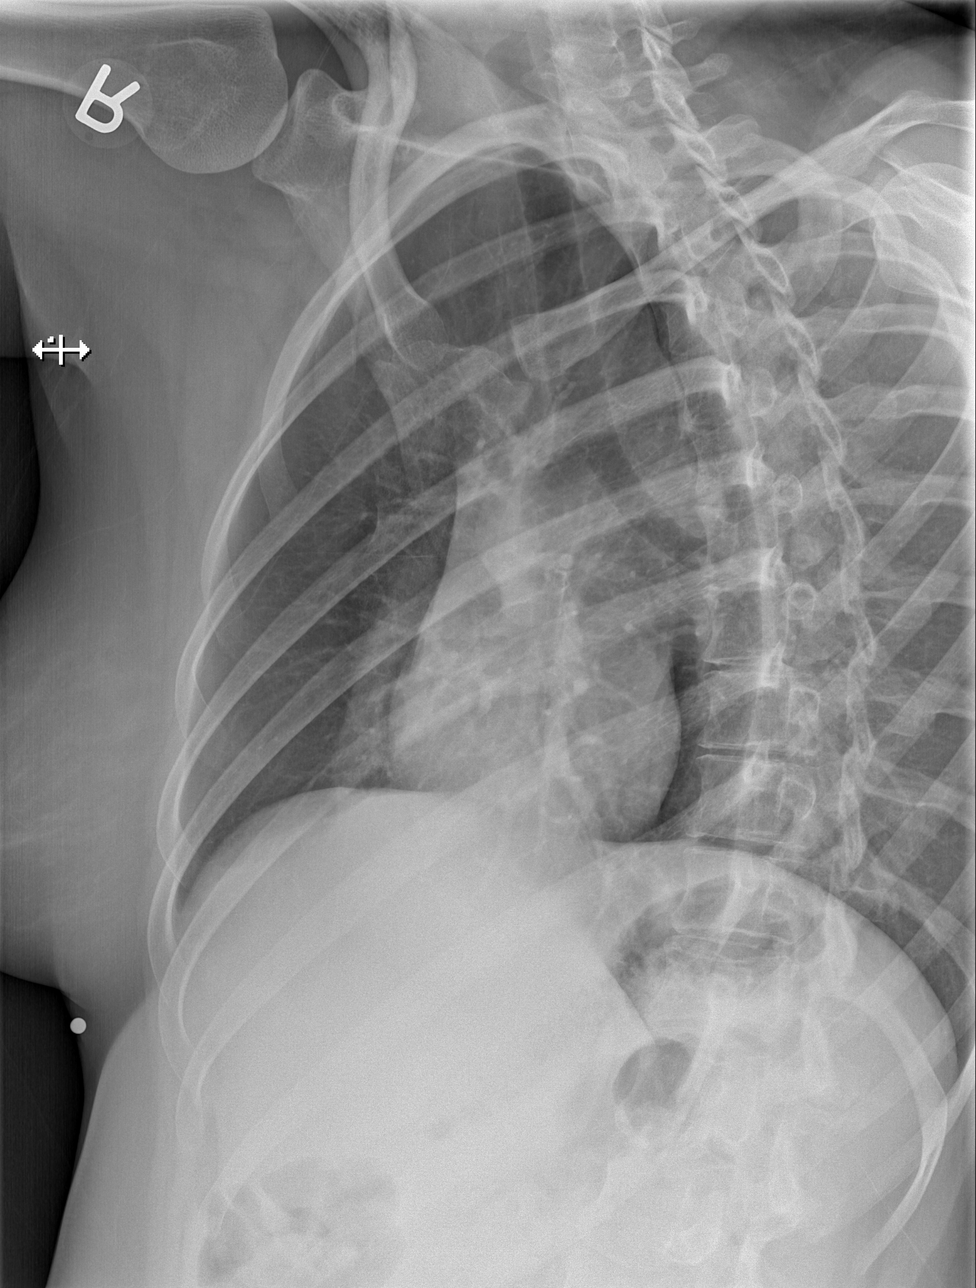

[4 of 4 positions shown; findings below may reference images not displayed]

FINDINGS: No fracture or other bone lesions are seen involving the ribs. There
is no evidence of pneumothorax or pleural effusion. Both lungs are
clear. Heart size and mediastinal contours are within normal limits.
IMPRESSION: No acute fracture dislocation of right ribs.
